# Patient Record
Sex: Female | Born: 1990 | Race: White | Hispanic: No | Marital: Single | Smoking: Never smoker
Health system: Southern US, Community
[De-identification: ages and names within clinical notes are randomized; demographics above are authoritative.]

## PROBLEM LIST (undated history)

## (undated) DIAGNOSIS — D649 Anemia, unspecified: Secondary | ICD-10-CM

## (undated) HISTORY — PX: NO PAST SURGERIES: SHX2092

---
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
## 2008-02-16 ENCOUNTER — Emergency Department: Payer: Self-pay | Admitting: Unknown Physician Specialty

## 2008-03-14 ENCOUNTER — Emergency Department: Payer: Self-pay | Admitting: Emergency Medicine

## 2010-01-30 ENCOUNTER — Emergency Department: Payer: Self-pay | Admitting: Emergency Medicine

## 2010-02-23 ENCOUNTER — Ambulatory Visit: Payer: Self-pay | Admitting: Family Medicine

## 2010-07-20 ENCOUNTER — Inpatient Hospital Stay: Payer: Self-pay | Admitting: Obstetrics and Gynecology

## 2011-02-01 ENCOUNTER — Emergency Department: Payer: Self-pay | Admitting: Emergency Medicine

## 2011-06-22 ENCOUNTER — Emergency Department: Payer: Self-pay | Admitting: Emergency Medicine

## 2011-09-01 ENCOUNTER — Emergency Department: Payer: Self-pay | Admitting: Emergency Medicine

## 2012-09-15 ENCOUNTER — Emergency Department: Payer: Self-pay | Admitting: Emergency Medicine

## 2013-03-03 ENCOUNTER — Ambulatory Visit: Payer: Self-pay

## 2013-07-07 ENCOUNTER — Emergency Department: Payer: Self-pay | Admitting: Emergency Medicine

## 2014-03-07 ENCOUNTER — Emergency Department: Payer: Self-pay | Admitting: Internal Medicine

## 2015-03-02 ENCOUNTER — Other Ambulatory Visit: Payer: Self-pay | Admitting: Physician Assistant

## 2015-03-02 DIAGNOSIS — Z3689 Encounter for other specified antenatal screening: Secondary | ICD-10-CM

## 2015-03-02 LAB — HM PAP SMEAR: HM Pap smear: NEGATIVE

## 2015-03-06 ENCOUNTER — Ambulatory Visit
Admission: RE | Admit: 2015-03-06 | Discharge: 2015-03-06 | Disposition: A | Discharge status: Home / Self Care | Payer: Medicaid Other | Source: Ambulatory Visit | Attending: Physician Assistant | Admitting: Physician Assistant

## 2015-03-06 DIAGNOSIS — Z3A28 28 weeks gestation of pregnancy: Secondary | ICD-10-CM | POA: Diagnosis not present

## 2015-03-06 DIAGNOSIS — Z36 Encounter for antenatal screening of mother: Secondary | ICD-10-CM | POA: Insufficient documentation

## 2015-03-06 DIAGNOSIS — Z3689 Encounter for other specified antenatal screening: Secondary | ICD-10-CM

## 2015-04-30 NOTE — L&D Delivery Note (Signed)
Obstetrical Delivery Note   Date of Delivery:   05/31/2015 Primary OB:   [facility] Gestational Age/EDD: [redacted]w[redacted]d (Dated by LMP=28wk u/s) Antepartum complications: Late prenatal care, poor dates, BMI 19, h/o abnormal pap smears, anemia, normal 3hr GTT  Delivered By:   Cornelia Copa MD  Delivery Type:   spontaneous vaginal delivery  Delivery Details:   CTSP because went from 3cm to SROM to 8cm to complete very quickly. Room set up and peds present and patient checked and complete, LOA and +2. Over next 2-3 contractions, patient easily delivered infant. Short cord noted; infant was vigorous and delayed cord clamping done and then cord was cut.  Anesthesia:    local Intrapartum complications: None GBS:    Negative Laceration:    1st degree perineal, repaired with 2-0 vicryl after injection of 10mL 1% lidocaine Episiotomy:    none Placenta:    Delivered and expressed via active management. Intact: yes. To pathology: no.  Estimated Blood Loss:   Baby:    Liveborn female, APGARs 8/9, weight 3480gm  Cornelia Copa. MD [employer] Pager [phone]

## 2015-05-30 ENCOUNTER — Inpatient Hospital Stay
Admission: EM | Admit: 2015-05-30 | Discharge: 2015-06-01 | DRG: 775 | Disposition: A | Discharge status: Home / Self Care | Payer: Medicaid Other | Attending: Obstetrics & Gynecology | Admitting: Obstetrics & Gynecology

## 2015-05-30 DIAGNOSIS — Z3A41 41 weeks gestation of pregnancy: Secondary | ICD-10-CM | POA: Diagnosis not present

## 2015-05-30 DIAGNOSIS — Z833 Family history of diabetes mellitus: Secondary | ICD-10-CM

## 2015-05-30 DIAGNOSIS — Z79899 Other long term (current) drug therapy: Secondary | ICD-10-CM

## 2015-05-30 DIAGNOSIS — O480 Post-term pregnancy: Secondary | ICD-10-CM | POA: Diagnosis present

## 2015-05-30 DIAGNOSIS — O99214 Obesity complicating childbirth: Principal | ICD-10-CM | POA: Diagnosis present

## 2015-05-30 DIAGNOSIS — Z6841 Body Mass Index (BMI) 40.0 and over, adult: Secondary | ICD-10-CM

## 2015-05-30 DIAGNOSIS — Z809 Family history of malignant neoplasm, unspecified: Secondary | ICD-10-CM | POA: Diagnosis not present

## 2015-05-30 HISTORY — DX: Anemia, unspecified: D64.9

## 2015-05-30 LAB — TYPE AND SCREEN
ABO/RH(D): O POS
ANTIBODY SCREEN: NEGATIVE

## 2015-05-30 LAB — URINE DRUG SCREEN, QUALITATIVE (ARMC ONLY)
Amphetamines, Ur Screen: NOT DETECTED
BENZODIAZEPINE, UR SCRN: NOT DETECTED
Barbiturates, Ur Screen: NOT DETECTED
Cannabinoid 50 Ng, Ur ~~LOC~~: NOT DETECTED
Cocaine Metabolite,Ur ~~LOC~~: NOT DETECTED
MDMA (Ecstasy)Ur Screen: NOT DETECTED
METHADONE SCREEN, URINE: NOT DETECTED
Opiate, Ur Screen: NOT DETECTED
PHENCYCLIDINE (PCP) UR S: NOT DETECTED
TRICYCLIC, UR SCREEN: NOT DETECTED

## 2015-05-30 MED ORDER — CITRIC ACID-SODIUM CITRATE 334-500 MG/5ML PO SOLN: 30.0000 mL | ORAL | Status: DC | PRN

## 2015-05-30 MED ORDER — OXYTOCIN 40 UNITS IN LACTATED RINGERS INFUSION - SIMPLE MED
1.0000 m[IU]/min | INTRAVENOUS | Status: DC
  Administered 2015-05-30: 2 m[IU]/min via INTRAVENOUS
  Administered 2015-05-31: 666 m[IU]/min via INTRAVENOUS

## 2015-05-30 MED ORDER — MISOPROSTOL 200 MCG PO TABS
ORAL_TABLET | ORAL | Status: AC
  Filled 2015-05-30: qty 4

## 2015-05-30 MED ORDER — LIDOCAINE HCL (PF) 1 % IJ SOLN
INTRAMUSCULAR | Status: AC
  Filled 2015-05-30: qty 30

## 2015-05-30 MED ORDER — LACTATED RINGERS IV SOLN: 500.0000 mL | INTRAVENOUS | Status: DC | PRN

## 2015-05-30 MED ORDER — OXYTOCIN 10 UNIT/ML IJ SOLN
INTRAMUSCULAR | Status: AC
  Filled 2015-05-30: qty 2

## 2015-05-30 MED ORDER — AMMONIA AROMATIC IN INHA
RESPIRATORY_TRACT | Status: AC
  Filled 2015-05-30: qty 10

## 2015-05-30 MED ORDER — ACETAMINOPHEN 325 MG PO TABS: 650.0000 mg | ORAL_TABLET | ORAL | Status: DC | PRN

## 2015-05-30 MED ORDER — LIDOCAINE HCL (PF) 1 % IJ SOLN: 30.0000 mL | INTRAMUSCULAR | Status: DC | PRN

## 2015-05-30 MED ORDER — LACTATED RINGERS IV SOLN
INTRAVENOUS | Status: DC
  Administered 2015-05-30: 22:00:00 via INTRAVENOUS

## 2015-05-30 MED ORDER — OXYTOCIN 40 UNITS IN LACTATED RINGERS INFUSION - SIMPLE MED
2.5000 [IU]/h | INTRAVENOUS | Status: DC
  Administered 2015-05-31: 2.5 [IU]/h via INTRAVENOUS
  Filled 2015-05-30: qty 1000

## 2015-05-30 MED ORDER — FENTANYL CITRATE (PF) 100 MCG/2ML IJ SOLN
50.0000 ug | INTRAMUSCULAR | Status: DC | PRN
  Administered 2015-05-31: 50 ug via INTRAVENOUS
  Filled 2015-05-30: qty 2

## 2015-05-30 MED ORDER — ONDANSETRON HCL 4 MG/2ML IJ SOLN: 4.0000 mg | Freq: Four times a day (QID) | INTRAMUSCULAR | Status: DC | PRN

## 2015-05-30 MED ORDER — OXYTOCIN BOLUS FROM INFUSION: 500.0000 mL | INTRAVENOUS | Status: DC

## 2015-05-30 MED ORDER — TERBUTALINE SULFATE 1 MG/ML IJ SOLN: 0.2500 mg | Freq: Once | INTRAMUSCULAR | Status: DC | PRN

## 2015-05-30 NOTE — H&P (Signed)
Obstetrics Admission History & Physical  05/30/2015 - 10:13 PM Primary OBGYN: [facility]  Chief Complaint: post dates IOL  History of Present Illness  25 y.o. [id number] @ [redacted]w[redacted]d (Dating: EDC 1/24, dated by LMP=28wk u/s), with the above CC. Pregnancy complicated by: late PNC, poor dates, BMI 43, h/o abnormal pap smears, anemia, normal 3hr  [name] denies any labor s/s or decreased FM. No s/s of pre-eclampsia  Review of Systems: , her 12 point review of systems is negative or as noted in the History of Present Illness.  PMHx:  Past Medical History  Diagnosis Date  . Anemia    PSHx:  Past Surgical History  Procedure Laterality Date  . No past surgeries     Medications:  Prescriptions prior to admission  Medication Sig Dispense Refill Last Dose  . ferrous sulfate 325 (65 FE) MG tablet Take 325 mg by mouth 2 (two) times daily with a meal.   05/30/2015 at Unknown time  . Multiple Vitamin (MULTIVITAMIN) tablet Take 1 tablet by mouth daily.   05/29/2015 at Unknown time    Allergies: has No Known Allergies. OBHx: as above. AB x 1. 06/2010 6lbs 12oz SVD, no issues with that pregnancy or delivery. GYNHx:  History of abnormal pap smears: Yes, a few years ago. Patient states that she believes she had a biopsy (maybe at [facility]?) but no h/o f/u after that History of STIs: Yes, remote          FHx:  Family History  Problem Relation Age of Onset  . Diabetes Mother   . Cancer Mother   . Cancer Maternal Grandmother   . Cancer Maternal Grandfather    Soc Hx:  Social History   Social History  . Marital Status: Single    Spouse Name: N/A  . Number of Children: N/A  . Years of Education: N/A   Occupational History  . Not on file.   Social History Main Topics  . Smoking status: Never Smoker   . Smokeless tobacco: Never Used  . Alcohol Use: No  . Drug Use: No  . Sexual Activity: Yes    Birth Control/ Protection: None   Other Topics Concern  . Not on file   Social History  Narrative  . No narrative on file    Objective    Current Vital Signs 24h Vital Sign Ranges  T 98.7 F (37.1 C) Temp  Avg: 98.7 F (37.1 C)  Min: 98.7 F (37.1 C)  Max: 98.7 F (37.1 C)  BP 133/90 mmHg BP  Min: 133/90  Max: 133/90  HR 79 Pulse  Avg: 79  Min: 79  Max: 79  RR 16 Resp  Avg: 16  Min: 16  Max: 16  SaO2    98/RA No Data Recorded       24 Hour I/O Current Shift I/O  Time Ins Outs      Rpt BP 133/86  EFM: 130 baseline, +accels, no decels, mod var  Toco: none  General: Well nourished, well developed female in no acute distress.  Skin:  Warm and dry.  Cardiovascular: Regular rate and rhythm. Respiratory:  Clear to auscultation bilateral. Normal respiratory effort Abdomen: obese, nttp Neuro/Psych:  Normal mood and affect.   SVE: per RN 2/50. At [facility] was 1-2/40-50/-3/soft/posterior on 1/26 Leopolds/EFW: 3400gm  Labs  pending  Radiology BSUS: cephalic, subjectively normal fluid and HR, station is high  Perinatal info  O pos/ Rubella  Immune / Varicella Immune/RPR pending/HIV Negative/HepB [facility]  Ag Negative/TDaP:needed / Flu shot: UTD/pap: none this pregnancy-->needed PP/OCPs  Assessment & Plan   25 y.o. G2P1011 @ 41/0 with post dates; pt stabel *IUP: category I with accels *IOL: will start with pitocin per protocol. Depending on how IOL goes, may need cx ripening but pt is borderline and a multip so will start with pitocin. Pt amenable to plan *BMI 41: fetus doesn't feel big. Will follow labor curve for surveillance for CPD *Late PNC: UDS ordered *BPs: will continue to follow and do HELLP labs PRN *GBS: neg *Analgesia: no needs  Cornelia Copa. MD [facility] OBGYN Pager [phone]

## 2015-05-31 LAB — CBC
HCT: 34.2 % — ABNORMAL LOW (ref 35.0–47.0)
Hemoglobin: 11.5 g/dL — ABNORMAL LOW (ref 12.0–16.0)
MCH: 30.1 pg (ref 26.0–34.0)
MCHC: 33.6 g/dL (ref 32.0–36.0)
MCV: 89.5 fL (ref 80.0–100.0)
PLATELETS: 170 10*3/uL (ref 150–440)
RBC: 3.82 MIL/uL (ref 3.80–5.20)
RDW: 14.5 % (ref 11.5–14.5)
WBC: 9.9 10*3/uL (ref 3.6–11.0)

## 2015-05-31 LAB — ABO/RH: ABO/RH(D): O POS

## 2015-05-31 MED ORDER — BENZOCAINE-MENTHOL 20-0.5 % EX AERO: 1.0000 "application " | INHALATION_SPRAY | CUTANEOUS | Status: DC | PRN

## 2015-05-31 MED ORDER — WITCH HAZEL-GLYCERIN EX PADS: 1.0000 "application " | MEDICATED_PAD | CUTANEOUS | Status: DC | PRN

## 2015-05-31 MED ORDER — DIBUCAINE 1 % RE OINT: 1.0000 "application " | TOPICAL_OINTMENT | RECTAL | Status: DC | PRN

## 2015-05-31 MED ORDER — PRENATAL MULTIVITAMIN CH
1.0000 | ORAL_TABLET | Freq: Every day | ORAL | Status: DC
  Administered 2015-05-31 – 2015-06-01 (×2): 1 via ORAL
  Filled 2015-05-31 (×3): qty 1

## 2015-05-31 MED ORDER — DIPHENHYDRAMINE HCL 25 MG PO CAPS: 25.0000 mg | ORAL_CAPSULE | Freq: Four times a day (QID) | ORAL | Status: DC | PRN

## 2015-05-31 MED ORDER — MAGNESIUM HYDROXIDE 400 MG/5ML PO SUSP: 30.0000 mL | ORAL | Status: DC | PRN

## 2015-05-31 MED ORDER — OXYTOCIN 40 UNITS IN LACTATED RINGERS INFUSION - SIMPLE MED
2.5000 [IU]/h | INTRAVENOUS | Status: DC | PRN
  Filled 2015-05-31: qty 1000

## 2015-05-31 MED ORDER — ACETAMINOPHEN 325 MG PO TABS: 650.0000 mg | ORAL_TABLET | ORAL | Status: DC | PRN

## 2015-05-31 MED ORDER — SIMETHICONE 80 MG PO CHEW: 80.0000 mg | CHEWABLE_TABLET | ORAL | Status: DC | PRN

## 2015-05-31 MED ORDER — OXYCODONE-ACETAMINOPHEN 5-325 MG PO TABS: 1.0000 | ORAL_TABLET | Freq: Four times a day (QID) | ORAL | Status: DC | PRN

## 2015-05-31 MED ORDER — DOCUSATE SODIUM 100 MG PO CAPS
100.0000 mg | ORAL_CAPSULE | Freq: Two times a day (BID) | ORAL | Status: DC
  Administered 2015-05-31 – 2015-06-01 (×2): 100 mg via ORAL
  Filled 2015-05-31 (×2): qty 1

## 2015-05-31 MED ORDER — LANOLIN HYDROUS EX OINT: TOPICAL_OINTMENT | CUTANEOUS | Status: DC | PRN

## 2015-05-31 MED ORDER — OXYCODONE-ACETAMINOPHEN 5-325 MG PO TABS: 2.0000 | ORAL_TABLET | Freq: Four times a day (QID) | ORAL | Status: DC | PRN

## 2015-05-31 MED ORDER — TETANUS-DIPHTH-ACELL PERTUSSIS 5-2.5-18.5 LF-MCG/0.5 IM SUSP: 0.5000 mL | Freq: Once | INTRAMUSCULAR | Status: DC

## 2015-05-31 MED ORDER — IBUPROFEN 600 MG PO TABS
600.0000 mg | ORAL_TABLET | Freq: Four times a day (QID) | ORAL | Status: DC
  Administered 2015-06-01: 600 mg via ORAL
  Filled 2015-05-31 (×2): qty 1

## 2015-05-31 MED ORDER — ONDANSETRON HCL 4 MG PO TABS: 4.0000 mg | ORAL_TABLET | ORAL | Status: DC | PRN

## 2015-05-31 MED ORDER — IBUPROFEN 600 MG PO TABS
600.0000 mg | ORAL_TABLET | Freq: Four times a day (QID) | ORAL | Status: DC
  Administered 2015-05-31 (×3): 600 mg via ORAL
  Filled 2015-05-31 (×4): qty 1

## 2015-05-31 MED ORDER — ONDANSETRON HCL 4 MG/2ML IJ SOLN: 4.0000 mg | INTRAMUSCULAR | Status: DC | PRN

## 2015-05-31 MED ORDER — SENNOSIDES-DOCUSATE SODIUM 8.6-50 MG PO TABS: 1.0000 | ORAL_TABLET | Freq: Every evening | ORAL | Status: DC | PRN

## 2015-05-31 NOTE — Progress Notes (Addendum)
Post Partum Day0 Subjective: no complaints, voiding and tolerating PO. Decided to bottle feed  Objective: Blood pressure 123/78, pulse 55, temperature 98 F (36.7 C), temperature source Oral, resp. rate 17, height  (1.626 m), weight 107.502 kg (237 lb), unknown if currently breastfeeding.  BP range: 123/78-143/83  Physical Exam:  General: alert and no distress Lochia: appropriate Uterine Fundus: firm/ U-1/ML/NT Perineum; healing well, no significant erythema DVT Evaluation: No evidence of DVT seen on physical exam.   Recent Labs  05/30/15 2233  HGB 11.5*  HCT 34.2*  WBC 9.9  PLT 170    Assessment/Plan: Stable day of delivery Continue pp care Possible discharge tomorrow O POS/ RI/ VI/ GBS negative Bottle/ pills   LOS: 1 day   Candice Clarke 05/31/2015, 10:22 AM

## 2015-05-31 NOTE — Discharge Summary (Signed)
Obstetrical Discharge Summary  Date of Admission: 05/30/2015 Date of Discharge: 06/01/2015  Primary OB: [facility]  Gestational Age at Delivery: [redacted]w[redacted]d   Antepartum complications: Late prenatal care, poor dates, BMI 72, h/o abnormal pap smears, anemia, normal 3hr GTT Reason for Admission: scheduled post dates IOL Date of Delivery: 06/01/2015  Delivered By: Cornelia Copa MD Delivery Type: spontaneous vaginal delivery Intrapartum complications/course: Precipitous labor after initiation of pitocin. UDS negative.  Anesthesia: local Placenta: Delivered and expressed via active management. Intact: yes. To pathology: no.  Laceration: 1st degree perineal (repaired) Episiotomy: none Baby: Liveborn female, APGARs 8/9, weight 3480 g.   Discharge Diagnosis: Delivered vaginal  Postpartum course: Uncomplicated. Tolerating PO, ambulating and voiding without difficulty. Pain well controlled Discharge Vital Signs:  Current Vital Signs 24h Vital Sign Ranges  T 98.5 F (36.9 C) Temp  Avg: 98.2 F (36.8 C)  Min: 97.7 F (36.5 C)  Max: 98.5 F (36.9 C)  BP 134/78 mmHg BP  Min: 113/58  Max: 134/78  HR 74 Pulse  Avg: 70.3  Min: 64  Max: 75  RR 20 Resp  Avg: 18  Min: 16  Max: 20  SaO2 100 % Not Delivered SpO2  Avg: 100 %  Min: 100 %  Max: 100 %       24 Hour I/O Current Shift I/O  Time Ins Outs   02/02 0701 - 02/02 1900 In: 240 [P.O.:240] Out: -     Patient Vitals for the past 6 hrs:  BP Temp Temp src Pulse Resp  06/01/15 0745 134/78 mmHg 98.5 F (36.9 C) Oral 74 20    Discharge Exam:  NAD Perineum: 1st degree laceration with repair Abdomen: firm fundus below the umbilicus. RRR no MRGs CTAB Ext: no c/c/e  Disposition: Home  Rh Immune globulin given: not applicable Rubella vaccine given: not applicable Tdap vaccine given in AP or PP setting: ordered PP Flu vaccine given in AP or PP setting: yes  Contraception: Ortho Tricyclen  Prenatal/Postnatal Panel: O POS//Rubella  Immune//Varicella Immune//RPR negative//HIV negative/HepB Surface Ag negative//pap history of abnormal paps in the past and no pap done this pregnancy. Pap smear needed at [facility] visit //plans to bottle feed//Flu/TDAP UTD  Plan:  Candice Clarke was discharged to home in good condition. Follow-up appointment with the [facility] in 4 weeks for a PP visit  Discharge Medications:   Medication List    STOP taking these medications        ferrous sulfate 325 (65 FE) MG tablet      TAKE these medications        multivitamin tablet  Take 1 tablet by mouth daily.     Norgestimate-Ethinyl Estradiol Triphasic 0.18/0.215/0.25 MG-35 MCG tablet  Commonly known as:  ORTHO TRI-CYCLEN (28)  Take 1 tablet by mouth daily.       Tresea Mall, CNM   This patient and plan were discussed with Dr Elesa Massed 06/01/2015

## 2015-05-31 NOTE — Discharge Instructions (Signed)
Vaginal Delivery, Care After Refer to this sheet in the next few weeks. These discharge instructions provide you with information on caring for yourself after delivery. Your caregiver may also give you specific instructions. Your treatment has been planned according to the most current medical practices available, but problems sometimes occur. Call your caregiver if you have any problems or questions after you go home. HOME CARE INSTRUCTIONS 1. Take over-the-counter or prescription medicines only as directed by your caregiver or pharmacist. 2. Do not drink alcohol, especially if you are breastfeeding or taking medicine to relieve pain. 3. Do not smoke tobacco. 4. Continue to use good perineal care. Good perineal care includes: 1. Wiping your perineum from back to front 2. Keeping your perineum clean. 3. You can do sitz baths twice a day, to help keep this area clean 5. Do not use tampons, douche or have sex until your caregiver says it is okay. 6. Shower only and avoid sitting in submerged water, aside from sitz baths 7. Wear a well-fitting bra that provides breast support. 8. Eat healthy foods. 9. Drink enough fluids to keep your urine clear or pale yellow. 10. Eat high-fiber foods such as whole grain cereals and breads, brown rice, beans, and fresh fruits and vegetables every day. These foods may help prevent or relieve constipation. 11. Avoid constipation with high fiber foods or medications, such as miralax or metamucil 12. Follow your caregiver's recommendations regarding resumption of activities such as climbing stairs, driving, lifting, exercising, or traveling. 13. Talk to your caregiver about resuming sexual activities. Resumption of sexual activities is dependent upon your risk of infection, your rate of healing, and your comfort and desire to resume sexual activity. 14. Try to have someone help you with your household activities and your newborn for at least a few days after you leave  the hospital. 15. Rest as much as possible. Try to rest or take a nap when your newborn is sleeping. 16. Increase your activities gradually. 17. Keep all of your scheduled postpartum appointments. It is very important to keep your scheduled follow-up appointments. At these appointments, your caregiver will be checking to make sure that you are healing physically and emotionally. SEEK MEDICAL CARE IF:   You are passing large clots from your vagina. Save any clots to show your caregiver.  You have a foul smelling discharge from your vagina.  You have trouble urinating.  You are urinating frequently.  You have pain when you urinate.  You have a change in your bowel movements.  You have increasing redness, pain, or swelling near your vaginal incision (episiotomy) or vaginal tear.  You have pus draining from your episiotomy or vaginal tear.  Your episiotomy or vaginal tear is separating.  You have painful, hard, or reddened breasts.  You have a severe headache.  You have blurred vision or see spots.  You feel sad or depressed.  You have thoughts of hurting yourself or your newborn.  You have questions about your care, the care of your newborn, or medicines.  You are dizzy or light-headed.  You have a rash.  You have nausea or vomiting.  You were breastfeeding and have not had a menstrual period within 12 weeks after you stopped breastfeeding.  You are not breastfeeding and have not had a menstrual period by the 12th week after delivery.  You have a fever. SEEK IMMEDIATE MEDICAL CARE IF:   You have persistent pain.  You have chest pain.  You have shortness of breath.    You faint.  You have leg pain.  You have stomach pain.  Your vaginal bleeding saturates two or more sanitary pads in 1 hour. MAKE SURE YOU:   Understand these instructions.  Will watch your condition.  Will get help right away if you are not doing well or get worse. Document Released:  04/12/2000 Document Revised: 08/30/2013 Document Reviewed: 12/11/2011 ExitCare Patient Information 2015 [employer]. This information is not intended to replace advice given to you by your health care provider. Make sure you discuss any questions you have with your health care provider.  Sitz Bath A sitz bath is a warm water bath taken in the sitting position. The water covers only the hips and butt (buttocks). We recommend using one that fits in the toilet, to help with ease of use and cleanliness. It may be used for either healing or cleaning purposes. Sitz baths are also used to relieve pain, itching, or muscle tightening (spasms). The water may contain medicine. Moist heat will help you heal and relax.  HOME CARE  Take 3 to 4 sitz baths a day. 18. Fill the bathtub half-full with warm water. 19. Sit in the water and open the drain a little. 20. Turn on the warm water to keep the tub half-full. Keep the water running constantly. 21. Soak in the water for 15 to 20 minutes. 22. After the sitz bath, pat the affected area dry. GET HELP RIGHT AWAY IF: You get worse instead of better. Stop the sitz baths if you get worse. MAKE SURE YOU:  Understand these instructions.  Will watch your condition.  Will get help right away if you are not doing well or get worse. Document Released: 05/23/2004 Document Revised: 01/08/2012 Document Reviewed: 08/13/2010 ExitCare Patient Information 2015 [employer]. This information is not intended to replace advice given to you by your health care provider. Make sure you discuss any questions you have with your health care provider.    

## 2015-06-01 ENCOUNTER — Encounter: Payer: Self-pay | Admitting: Obstetrics and Gynecology

## 2015-06-01 LAB — RPR: RPR Ser Ql: NONREACTIVE

## 2015-06-01 MED ORDER — NORGESTIM-ETH ESTRAD TRIPHASIC 0.18/0.215/0.25 MG-35 MCG PO TABS: 1.0000 | ORAL_TABLET | Freq: Every day | ORAL | Status: DC

## 2015-06-01 NOTE — Progress Notes (Signed)
Rx given for home use...discharged to home via wheelchair

## 2016-07-18 LAB — HM HIV SCREENING LAB: HM HIV Screening: NEGATIVE

## 2016-09-20 DIAGNOSIS — M545 Low back pain: Secondary | ICD-10-CM | POA: Insufficient documentation

## 2016-09-20 NOTE — ED Triage Notes (Signed)
Pt presents to ED via POV with c/o lower back pain without known injury or trauma. Pt reports pain is aching and stabbing, denies radiation of pain into lower extremities. Pt denies any change in urinary habits or frequency, denies pain or burning during urination. Pt is ambulatory to triage with steady gait, no difficulty or distress observed.

## 2016-09-21 ENCOUNTER — Emergency Department: Payer: Self-pay

## 2016-09-21 ENCOUNTER — Emergency Department
Admission: EM | Admit: 2016-09-21 | Discharge: 2016-09-21 | Disposition: A | Discharge status: Home / Self Care | Payer: Self-pay | Attending: Emergency Medicine | Admitting: Emergency Medicine

## 2016-09-21 DIAGNOSIS — M545 Low back pain, unspecified: Secondary | ICD-10-CM

## 2016-09-21 LAB — URINALYSIS, COMPLETE (UACMP) WITH MICROSCOPIC
Bilirubin Urine: NEGATIVE
Glucose, UA: NEGATIVE mg/dL
Hgb urine dipstick: NEGATIVE
Ketones, ur: NEGATIVE mg/dL
Nitrite: NEGATIVE
PH: 5 (ref 5.0–8.0)
Protein, ur: NEGATIVE mg/dL
SPECIFIC GRAVITY, URINE: 1.021 (ref 1.005–1.030)

## 2016-09-21 LAB — PREGNANCY, URINE: Preg Test, Ur: NEGATIVE

## 2016-09-21 MED ORDER — TRAMADOL HCL 50 MG PO TABS: 50.0000 mg | ORAL_TABLET | Freq: Four times a day (QID) | ORAL | 0 refills | Status: DC | PRN

## 2016-09-21 MED ORDER — KETOROLAC TROMETHAMINE 60 MG/2ML IM SOLN
60.0000 mg | Freq: Once | INTRAMUSCULAR | Status: AC
  Administered 2016-09-21: 60 mg via INTRAMUSCULAR
  Filled 2016-09-21: qty 2

## 2016-09-21 MED ORDER — LIDOCAINE 5 % EX PTCH: 1.0000 | MEDICATED_PATCH | Freq: Two times a day (BID) | CUTANEOUS | 0 refills | Status: DC

## 2016-09-21 MED ORDER — DIAZEPAM 2 MG PO TABS: 2.0000 mg | ORAL_TABLET | Freq: Four times a day (QID) | ORAL | 0 refills | Status: DC | PRN

## 2016-09-21 MED ORDER — LIDOCAINE 5 % EX PTCH
1.0000 | MEDICATED_PATCH | CUTANEOUS | Status: DC
  Administered 2016-09-21: 1 via TRANSDERMAL
  Filled 2016-09-21: qty 1

## 2016-09-21 MED ORDER — DIAZEPAM 5 MG PO TABS
5.0000 mg | ORAL_TABLET | Freq: Once | ORAL | Status: AC
  Administered 2016-09-21: 5 mg via ORAL
  Filled 2016-09-21: qty 1

## 2016-09-21 NOTE — ED Provider Notes (Signed)
[facility] Emergency Department Provider Note   ____________________________________________   First MD Initiated Contact with Patient 09/21/16 0106     (approximate)  I have reviewed the triage vital signs and the nursing notes.   HISTORY  Chief Complaint Back Pain    HPI Candice Clarke is a 26 y.o. female who comes into the hospital today with some back pain. She reports that the symptoms started yesterday. She denies doing any activities to hurt her back and denies any trauma. The pain is in her lower back. The patient took some ibuprofen at home but reports that it didn't help. The patient rates her pain 8 out of 10 in intensity currently. She denies any difficulty with urination or with bowel movements. The patient denies having pain like this in the past. She denies any other pain and states that it's just her back that hurts. She is here today for evaluation.   Past Medical History:  Diagnosis Date  . Anemia     Patient Active Problem List   Diagnosis Date Noted  . Postpartum care following vaginal delivery 06/01/2015  . Post-dates pregnancy 05/30/2015    Past Surgical History:  Procedure Laterality Date  . NO PAST SURGERIES      Prior to Admission medications   Medication Sig Start Date End Date Taking? Authorizing Provider  diazepam (VALIUM) 2 MG tablet Take 1 tablet (2 mg total) by mouth every 6 (six) hours as needed for anxiety. 09/21/16   Rebecka Apley, MD  lidocaine (LIDODERM) 5 % Place 1 patch onto the skin every 12 (twelve) hours. Remove & Discard patch within 12 hours or as directed by MD 09/21/16 09/21/17  Rebecka Apley, MD  Multiple Vitamin (MULTIVITAMIN) tablet Take 1 tablet by mouth daily.    [provider]  Norgestimate-Ethinyl Estradiol Triphasic (ORTHO TRI-CYCLEN, 28,) 0.18/0.215/0.25 MG-35 MCG tablet Take 1 tablet by mouth daily. 06/01/15   Tresea Mall, CNM  traMADol (ULTRAM) 50 MG tablet Take 1 tablet (50  mg total) by mouth every 6 (six) hours as needed. 09/21/16   Rebecka Apley, MD    Allergies Patient has no known allergies.  Family History  Problem Relation Age of Onset  . Diabetes Mother   . Cancer Mother   . Cancer Maternal Grandmother   . Cancer Maternal Grandfather     Social History Social History  Substance Use Topics  . Smoking status: Never Smoker  . Smokeless tobacco: Never Used  . Alcohol use No    Review of Systems  Constitutional: No fever/chills Eyes: No visual changes. ENT: No sore throat. Cardiovascular: Denies chest pain. Respiratory: Denies shortness of breath. Gastrointestinal: No abdominal pain.  No nausea, no vomiting.  No diarrhea.  No constipation. Genitourinary: Negative for dysuria. Musculoskeletal:  back pain. Skin: Negative for rash. Neurological: Negative for headaches, focal weakness or numbness.   ____________________________________________   PHYSICAL EXAM:  VITAL SIGNS: ED Triage Vitals  Enc Vitals Group     BP 09/20/16 2244 129/75     Pulse Rate 09/20/16 2244 79     Resp 09/20/16 2244 18     Temp 09/20/16 2244 98 F (36.7 C)     Temp Source 09/20/16 2244 Oral     SpO2 09/20/16 2244 98 %     Weight 09/20/16 2239 250 lb (113.4 kg)     Height 09/20/16 2239 5\' 4"  (1.626 m)     Head Circumference --      Peak  Flow --      Pain Score 09/20/16 2239 8     Pain Loc --      Pain Edu? --      Excl. in GC? --     Constitutional: Alert and oriented. Well appearing and in Mild distress. Eyes: Conjunctivae are normal. PERRL. EOMI. Head: Atraumatic. Nose: No congestion/rhinnorhea. Mouth/Throat: Mucous membranes are moist.  Oropharynx non-erythematous. Cardiovascular: Normal rate, regular rhythm. Grossly normal heart sounds.  Good peripheral circulation. Respiratory: Normal respiratory effort.  No retractions. Lungs CTAB. Gastrointestinal: Soft and nontender. No distention. Positive bowel sounds Musculoskeletal: No lower  extremity tenderness nor edema.  Lower lumbar spine tenderness to palpation with negative straight leg raise Neurologic:  Normal speech and language.  Skin:  Skin is warm, dry and intact.  Psychiatric: Mood and affect are normal.   ____________________________________________   LABS (all labs ordered are listed, but only abnormal results are displayed)  Labs Reviewed  URINALYSIS, COMPLETE (UACMP) WITH MICROSCOPIC - Abnormal; Notable for the following:       Result Value   Color, Urine YELLOW (*)    APPearance HAZY (*)    Leukocytes, UA TRACE (*)    Bacteria, UA RARE (*)    Squamous Epithelial / LPF 6-30 (*)    All other components within normal limits  PREGNANCY, URINE   ____________________________________________  EKG  none ____________________________________________  RADIOLOGY  Lumbar spine xray ____________________________________________   PROCEDURES  Procedure(s) performed: None  Procedures  Critical Care performed: No  ____________________________________________   INITIAL IMPRESSION / ASSESSMENT AND PLAN / ED COURSE  Pertinent labs & imaging results that were available during my care of the patient were reviewed by me and considered in my medical decision making (see chart for details).  This is a 26 year old who comes into the hospital today with lower back pain. I will place a Lidoderm patch to the patient's back. The patient will also receive some Toradol and Valium. I did x-ray the patient's back. I will reassess the patient.  Clinical Course as of Sep 22 239  Sat Sep 21, 2016  0221 Negative DG Lumbar Spine 2-3 Views [AW]    Clinical Course User Index [AW] Rebecka ApleyWebster, Kiersten Coss P, MD   The patient's pain is improved. Her urine and imaging studies are unremarkable. She will be discharged home to follow up with the acute care clinic.  ____________________________________________   FINAL CLINICAL IMPRESSION(S) / ED DIAGNOSES  Final diagnoses:    Acute midline low back pain without sciatica      NEW MEDICATIONS STARTED DURING THIS VISIT:  New Prescriptions   DIAZEPAM (VALIUM) 2 MG TABLET    Take 1 tablet (2 mg total) by mouth every 6 (six) hours as needed for anxiety.   LIDOCAINE (LIDODERM) 5 %    Place 1 patch onto the skin every 12 (twelve) hours. Remove & Discard patch within 12 hours or as directed by MD   TRAMADOL (ULTRAM) 50 MG TABLET    Take 1 tablet (50 mg total) by mouth every 6 (six) hours as needed.     Note:  This document was prepared using Dragon voice recognition software and may include unintentional dictation errors.    Rebecka ApleyWebster, Gianny Sabino P, MD 09/21/16 [phone]

## 2016-09-21 NOTE — Discharge Instructions (Signed)
Please follow up with the acute care clinic °

## 2016-10-27 ENCOUNTER — Emergency Department
Admission: EM | Admit: 2016-10-27 | Discharge: 2016-10-27 | Disposition: A | Discharge status: Home / Self Care | Payer: Self-pay | Attending: Emergency Medicine | Admitting: Emergency Medicine

## 2016-10-27 ENCOUNTER — Emergency Department: Payer: Self-pay

## 2016-10-27 ENCOUNTER — Encounter: Payer: Self-pay | Admitting: Emergency Medicine

## 2016-10-27 DIAGNOSIS — Z79899 Other long term (current) drug therapy: Secondary | ICD-10-CM | POA: Insufficient documentation

## 2016-10-27 DIAGNOSIS — M25561 Pain in right knee: Secondary | ICD-10-CM | POA: Insufficient documentation

## 2016-10-27 MED ORDER — NAPROXEN 500 MG PO TABS: 500.0000 mg | ORAL_TABLET | Freq: Two times a day (BID) | ORAL | 0 refills | Status: DC

## 2016-10-27 NOTE — ED Triage Notes (Signed)
C/O right knee pain.  Denies injury.  States pain started yesterday.  STates her job requires that she stands for long periods of time, causing knee to hurt.

## 2016-10-27 NOTE — ED Provider Notes (Signed)
[facility] Emergency Department Provider Note  ____________________________________________  Time seen: Approximately 1:54 PM  I have reviewed the triage vital signs and the nursing notes.   HISTORY  Chief Complaint Knee Pain    HPI Candice PigeonSherry N Clarke is a 26 y.o. female that presents to the emergency department with right knee pain for one day. She feels like the front of her knee is "deformed." Pain is sharp in character, and it hurts worse to bend. Patient states that she is on her feet all day at work.  No trauma. She denies fever, shortness breath, chest pain, nausea, vomiting, abdominal pain, rash, numbness, tingling, calf pain.   Past Medical History:  Diagnosis Date  . Anemia     Patient Active Problem List   Diagnosis Date Noted  . Postpartum care following vaginal delivery 06/01/2015  . Post-dates pregnancy 05/30/2015    Past Surgical History:  Procedure Laterality Date  . NO PAST SURGERIES      Prior to Admission medications   Medication Sig Start Date End Date Taking? Authorizing Provider  diazepam (VALIUM) 2 MG tablet Take 1 tablet (2 mg total) by mouth every 6 (six) hours as needed for anxiety. 09/21/16   Rebecka ApleyWebster, Allison P, MD  lidocaine (LIDODERM) 5 % Place 1 patch onto the skin every 12 (twelve) hours. Remove & Discard patch within 12 hours or as directed by MD 09/21/16 09/21/17  Rebecka ApleyWebster, Allison P, MD  Multiple Vitamin (MULTIVITAMIN) tablet Take 1 tablet by mouth daily.    [provider]  naproxen (NAPROSYN) 500 MG tablet Take 1 tablet (500 mg total) by mouth 2 (two) times daily with a meal. 10/27/16 10/27/17  Enid DerryWagner, Kavir Savoca, PA-C  Norgestimate-Ethinyl Estradiol Triphasic (ORTHO TRI-CYCLEN, 28,) 0.18/0.215/0.25 MG-35 MCG tablet Take 1 tablet by mouth daily. 06/01/15   Tresea MallGledhill, Jane, CNM  traMADol (ULTRAM) 50 MG tablet Take 1 tablet (50 mg total) by mouth every 6 (six) hours as needed. 09/21/16   Rebecka ApleyWebster, Allison P, MD     Allergies Patient has no known allergies.  Family History  Problem Relation Age of Onset  . Diabetes Mother   . Cancer Mother   . Cancer Maternal Grandmother   . Cancer Maternal Grandfather     Social History Social History  Substance Use Topics  . Smoking status: Never Smoker  . Smokeless tobacco: Never Used  . Alcohol use No     Review of Systems  Constitutional: No fever/chills Cardiovascular: No chest pain. Respiratory: No SOB. Gastrointestinal: No abdominal pain.  No nausea, no vomiting.  Musculoskeletal: Positive for knee pain. Skin: Negative for rash, abrasions, lacerations, ecchymosis. Neurological: Negative for numbness or tingling   ____________________________________________   PHYSICAL EXAM:  VITAL SIGNS: ED Triage Vitals  Enc Vitals Group     BP 10/27/16 1228 111/82     Pulse Rate 10/27/16 1228 68     Resp 10/27/16 1228 20     Temp 10/27/16 1228 98.4 F (36.9 C)     Temp Source 10/27/16 1228 Oral     SpO2 10/27/16 1228 98 %     Weight 10/27/16 1201 250 lb (113.4 kg)     Height 10/27/16 1201 5\' 4"  (1.626 m)     Head Circumference --      Peak Flow --      Pain Score 10/27/16 1201 7     Pain Loc --      Pain Edu? --      Excl. in GC? --  Constitutional: Alert and oriented. Well appearing and in no acute distress. Eyes: Conjunctivae are normal. PERRL. EOMI. Head: Atraumatic. ENT:      Ears:      Nose: No congestion/rhinnorhea.      Mouth/Throat: Mucous membranes are moist.  Neck: No stridor.   Cardiovascular: Normal rate, regular rhythm.  Good peripheral circulation. Respiratory: Normal respiratory effort without tachypnea or retractions. Lungs CTAB. Good air entry to the bases with no decreased or absent breath sounds. Musculoskeletal: Full range of motion to all extremities. No gross deformities appreciated. Tenderness to palpation over anterior patella. No effusion noted. Negative anterior drawer, posterior drawer, valgus,  varus, mcMurray, patella apprehension, apley grind. No calf pain. Neurologic:  Normal speech and language. No gross focal neurologic deficits are appreciated.  Skin:  Skin is warm, dry and intact. No rash noted.   ____________________________________________   LABS (all labs ordered are listed, but only abnormal results are displayed)  Labs Reviewed - No data to display ____________________________________________  EKG   ____________________________________________  RADIOLOGY Lexine Baton, personally viewed and evaluated these images (plain radiographs) as part of my medical decision making, as well as reviewing the written report by the radiologist.  Dg Knee Complete 4 Views Right  Result Date: 10/27/2016 CLINICAL DATA:  Right knee pain since yesterday. EXAM: RIGHT KNEE - COMPLETE 4+ VIEW COMPARISON:  None. FINDINGS: The joint spaces are maintained. No fracture or osteochondral lesion. No joint effusion. IMPRESSION: Normal knee radiographs. Electronically Signed   By: Rudie Meyer M.D.   On: 10/27/2016 14:46    ____________________________________________    PROCEDURES  Procedure(s) performed:    Procedures    Medications - No data to display   ____________________________________________   INITIAL IMPRESSION / ASSESSMENT AND PLAN / ED COURSE  Pertinent labs & imaging results that were available during my care of the patient were reviewed by me and considered in my medical decision making (see chart for details).  Review of the [place] CSRS was performed in accordance of the [facility] prior to dispensing any controlled drugs.   Patient presented to the emergency department for evaluation of knee pain for one day. Vital signs and exam are reassuring. X-ray negative for acute bony abnormalities. No signs of infection. Patient does not want me wrap or crutches. Patient will be discharged home with prescriptions for naproxen. Patient is to follow up with PCP as directed.  Patient is given ED precautions to return to the ED for any worsening or new symptoms.     ____________________________________________  FINAL CLINICAL IMPRESSION(S) / ED DIAGNOSES  Final diagnoses:  Acute pain of right knee      NEW MEDICATIONS STARTED DURING THIS VISIT:  Discharge Medication List as of 10/27/2016  3:12 PM    START taking these medications   Details  naproxen (NAPROSYN) 500 MG tablet Take 1 tablet (500 mg total) by mouth 2 (two) times daily with a meal., Starting Sun 10/27/2016, Until Mon 10/27/2017, Print            This chart was dictated using voice recognition software/Dragon. Despite best efforts to proofread, errors can occur which can change the meaning. Any change was purely unintentional.    Enid Derry, PA-C 10/27/16 1523    Jene Every, MD 10/27/16 [phone]

## 2017-05-04 ENCOUNTER — Other Ambulatory Visit: Payer: Self-pay

## 2017-05-04 ENCOUNTER — Encounter: Payer: Self-pay | Admitting: Emergency Medicine

## 2017-05-04 ENCOUNTER — Emergency Department
Admission: EM | Admit: 2017-05-04 | Discharge: 2017-05-04 | Disposition: A | Discharge status: Home / Self Care | Payer: Self-pay | Attending: Emergency Medicine | Admitting: Emergency Medicine

## 2017-05-04 DIAGNOSIS — N764 Abscess of vulva: Secondary | ICD-10-CM | POA: Insufficient documentation

## 2017-05-04 DIAGNOSIS — L0291 Cutaneous abscess, unspecified: Secondary | ICD-10-CM

## 2017-05-04 MED ORDER — CLINDAMYCIN HCL 300 MG PO CAPS: 300.0000 mg | ORAL_CAPSULE | Freq: Three times a day (TID) | ORAL | 0 refills | Status: AC

## 2017-05-04 NOTE — ED Provider Notes (Signed)
[facility] Emergency Department Provider Note  ____________________________________________   I have reviewed the triage vital signs and the nursing notes.   HISTORY  Chief Complaint Abscess   History limited by: Not Limited   HPI Candice Clarke is a 27 y.o. female who presents to the emergency department today because of concern for abscess   LOCATION:left labia DURATION:2 days TIMING: constant, worsening SEVERITY: severe QUALITY: painful, swelling CONTEXT: patient states that she first noticed discomfort 2 days ago. Starting yesterday noticed swelling and bump to left labia. Denies similar symptoms in the past. MODIFYING FACTORS: worse when wiping ASSOCIATED SYMPTOMS: denies any fevers. No nausea or vomiting. No abnormal vaginal discharge.   Per medical record review patient has a history of anemia.   Past Medical History:  Diagnosis Date  . Anemia     Patient Active Problem List   Diagnosis Date Noted  . Postpartum care following vaginal delivery 06/01/2015  . Post-dates pregnancy 05/30/2015    Past Surgical History:  Procedure Laterality Date  . NO PAST SURGERIES      Prior to Admission medications   Not on File    Allergies Patient has no known allergies.  Family History  Problem Relation Age of Onset  . Diabetes Mother   . Cancer Mother   . Cancer Maternal Grandmother   . Cancer Maternal Grandfather     Social History Social History   Tobacco Use  . Smoking status: Never Smoker  . Smokeless tobacco: Never Used  Substance Use Topics  . Alcohol use: No  . Drug use: No    Review of Systems Constitutional: No fever/chills Eyes: No visual changes. ENT: No sore throat. Cardiovascular: Denies chest pain. Respiratory: Denies shortness of breath. Gastrointestinal: No abdominal pain.  No nausea, no vomiting.  No diarrhea.   Genitourinary: Positive for swelling and pain to vulva.  Musculoskeletal: Negative for back  pain. Skin: Negative for rash. Neurological: Negative for headaches, focal weakness or numbness.  ____________________________________________   PHYSICAL EXAM:  VITAL SIGNS: ED Triage Vitals  Enc Vitals Group     BP 05/04/17 0530 132/70     Pulse Rate 05/04/17 0530 72     Resp 05/04/17 0530 16     Temp 05/04/17 0530 98.1 F (36.7 C)     Temp Source 05/04/17 0530 Oral     SpO2 05/04/17 0530 98 %     Weight 05/04/17 0531 250 lb (113.4 kg)     Height 05/04/17 0531 5\' 4"  (1.626 m)     Head Circumference --      Peak Flow --      Pain Score 05/04/17 0536 5   Constitutional: Alert and oriented. Well appearing and in no distress. Eyes: Conjunctivae are normal.  ENT   Head: Normocephalic and atraumatic.   Nose: No congestion/rhinnorhea.   Mouth/Throat: Mucous membranes are moist.   Neck: No stridor. Hematological/Lymphatic/Immunilogical: No cervical lymphadenopathy. Cardiovascular: Normal rate, regular rhythm.  No murmurs, rubs, or gallops.  Respiratory: Normal respiratory effort without tachypnea nor retractions. Breath sounds are clear and equal bilaterally. No wheezes/rales/rhonchi. Gastrointestinal: Soft and non tender. No rebound. No guarding.  Genitourinary: Small, roughly 1 cm diameter, area of swelling and tenderness to left labia majora. Was spontaneously draining.  Musculoskeletal: Normal range of motion in all extremities. No lower extremity edema. Neurologic:  Normal speech and language. No gross focal neurologic deficits are appreciated.  Skin:  Skin is warm, dry and intact. No rash noted. Psychiatric: Mood and  affect are normal. Speech and behavior are normal. Patient exhibits appropriate insight and judgment.  ____________________________________________    LABS (pertinent positives/negatives)  None  ____________________________________________   EKG  None  ____________________________________________     RADIOLOGY  None  ____________________________________________   PROCEDURES  Procedures  ____________________________________________   INITIAL IMPRESSION / ASSESSMENT AND PLAN / ED COURSE  Pertinent labs & imaging results that were available during my care of the patient were reviewed by me and considered in my medical decision making (see chart for details).  Patient presented because of concern for possible abscess to left labia. ON exam she has a small abscess that was spontaneously draining. Further pus was expressed. Will place on short course of antibiotics given continued tenderness and erythema. Discussed care with patient.   ____________________________________________   FINAL CLINICAL IMPRESSION(S) / ED DIAGNOSES  Final diagnoses:  Abscess     Note: This dictation was prepared with Dragon dictation. Any transcriptional errors that result from this process are unintentional     Phineas Semen, MD 05/04/17 [phone]

## 2017-05-04 NOTE — ED Triage Notes (Addendum)
Pt says she noticed a "knot" on her left labia; uncomfortable when she wipes after voiding; area feels "itchy" when sitting; denies drainage from site; noticed area 2 days ago

## 2017-05-04 NOTE — Discharge Instructions (Signed)
Please seek medical attention for any high fevers, chest pain, shortness of breath, change in behavior, persistent vomiting, bloody stool or any other new or concerning symptoms.  

## 2018-06-11 ENCOUNTER — Ambulatory Visit
Admission: EM | Admit: 2018-06-11 | Discharge: 2018-06-11 | Disposition: A | Discharge status: Home / Self Care | Payer: Self-pay | Attending: Family Medicine | Admitting: Family Medicine

## 2018-06-11 ENCOUNTER — Other Ambulatory Visit: Payer: Self-pay

## 2018-06-11 ENCOUNTER — Encounter: Payer: Self-pay | Admitting: Emergency Medicine

## 2018-06-11 DIAGNOSIS — J069 Acute upper respiratory infection, unspecified: Secondary | ICD-10-CM

## 2018-06-11 MED ORDER — BENZONATATE 200 MG PO CAPS: ORAL_CAPSULE | ORAL | 0 refills | Status: AC

## 2018-06-11 MED ORDER — FLUTICASONE PROPIONATE 50 MCG/ACT NA SUSP: 2.0000 | Freq: Every day | NASAL | 0 refills | Status: AC

## 2018-06-11 NOTE — ED Triage Notes (Signed)
Patient c/o cough and congestion x 1 week. Patient denies fever. Patient has been taking Tylenol cold and flu OTC for her symptoms.

## 2018-06-11 NOTE — ED Provider Notes (Signed)
[facility]    CSN: [id number] Arrival date & time: 06/11/18  1624     History   Chief Complaint Chief Complaint  Patient presents with  . Cough    HPI Candice Clarke is a 28 y.o. female.   HPI  Female presents with a cough and congestion that she has had for 1 week.  She is had no fevers.  She has postnasal drip.  She states that the cough has been so violent at times,she will actually gag and will vomit yellow frothy vomitus.  Been trying Tylenol Cold and flu for symptoms but has not been helping much.  Cough is much worse at nighttime.         Past Medical History:  Diagnosis Date  . Anemia     Patient Active Problem List   Diagnosis Date Noted  . Postpartum care following vaginal delivery 06/01/2015  . Post-dates pregnancy 05/30/2015    Past Surgical History:  Procedure Laterality Date  . NO PAST SURGERIES      OB History    Gravida  2   Para  2   Term  2   Preterm      AB      Living  2     SAB      TAB      Ectopic      Multiple  0   Live Births  2            Home Medications    Prior to Admission medications   Medication Sig Start Date End Date Taking? Authorizing Provider  benzonatate (TESSALON) 200 MG capsule Take one cap TID PRN cough 06/11/18   Ovid Curd P, PA-C  fluticasone [facility]) 50 MCG/ACT nasal spray Place 2 sprays into both nostrils daily. 06/11/18   Lutricia Feil, PA-C    Family History Family History  Problem Relation Age of Onset  . Diabetes Mother   . Cancer Mother   . Cancer Maternal Grandmother   . Cancer Maternal Grandfather     Social History Social History   Tobacco Use  . Smoking status: Never Smoker  . Smokeless tobacco: Never Used  Substance Use Topics  . Alcohol use: No  . Drug use: No     Allergies   Patient has no known allergies.   Review of Systems Review of Systems  Constitutional: Positive for activity change and appetite change. Negative for chills,  fatigue and fever.  HENT: Positive for congestion, postnasal drip and rhinorrhea.   Respiratory: Positive for cough.   Gastrointestinal: Positive for vomiting.  All other systems reviewed and are negative.    Physical Exam Triage Vital Signs ED Triage Vitals  Enc Vitals Group     BP 06/11/18 1642 (!) 144/98     Pulse Rate 06/11/18 1642 (!) 59     Resp 06/11/18 1642 18     Temp 06/11/18 1642 97.8 F (36.6 C)     Temp Source 06/11/18 1642 Oral     SpO2 06/11/18 1642 97 %     Weight 06/11/18 1641 280 lb (127 kg)     Height 06/11/18 1641 5\' 5"  (1.651 m)     Head Circumference --      Peak Flow --      Pain Score 06/11/18 1641 0     Pain Loc --      Pain Edu? --      Excl. in GC? --    No  data found.  Updated Vital Signs BP (!) 144/98 (BP Location: Left Arm)   Pulse (!) 59   Temp 97.8 F (36.6 C) (Oral)   Resp 18   Ht 5\' 5"  (1.651 m)   Wt 280 lb (127 kg)   LMP 06/08/2018   SpO2 97%   BMI 46.59 kg/m   Visual Acuity Right Eye Distance:   Left Eye Distance:   Bilateral Distance:    Right Eye Near:   Left Eye Near:    Bilateral Near:     Physical Exam Vitals signs and nursing note reviewed.  Constitutional:      General: She is not in acute distress.    Appearance: Normal appearance. She is obese. She is not ill-appearing, toxic-appearing or diaphoretic.  HENT:     Head: Normocephalic and atraumatic.     Right Ear: Tympanic membrane, ear canal and external ear normal.     Left Ear: Tympanic membrane, ear canal and external ear normal.     Nose: Rhinorrhea present. No congestion.     Mouth/Throat:     Mouth: Mucous membranes are moist.     Pharynx: Oropharynx is clear. No oropharyngeal exudate or posterior oropharyngeal erythema.  Eyes:     General:        Right eye: No discharge.        Left eye: No discharge.     Conjunctiva/sclera: Conjunctivae normal.  Neck:     Musculoskeletal: Normal range of motion and neck supple.  Pulmonary:     Effort:  Pulmonary effort is normal.     Breath sounds: Normal breath sounds.  Musculoskeletal: Normal range of motion.  Lymphadenopathy:     Cervical: No cervical adenopathy.  Skin:    General: Skin is warm and dry.  Neurological:     General: No focal deficit present.     Mental Status: She is alert and oriented to person, place, and time.  Psychiatric:        Mood and Affect: Mood normal.        Behavior: Behavior normal.        Thought Content: Thought content normal.        Judgment: Judgment normal.      UC Treatments / Results  Labs (all labs ordered are listed, but only abnormal results are displayed) Labs Reviewed - No data to display  EKG None  Radiology No results found.  Procedures Procedures (including critical care time)  Medications Ordered in UC Medications - No data to display  Initial Impression / Assessment and Plan / UC Course  I have reviewed the triage vital signs and the nursing notes.  Pertinent labs & imaging results that were available during my care of the patient were reviewed by me and considered in my medical decision making (see chart for details).   Has a upper respiratory infection.  Treat her with cough suppressants.  He does not want any cough syrups and prefers [employer].  Recommended the use of Flonase to help with the postnasal drip which may be triggering her vomiting.  She is not improving she should follow-up with her primary care or may return to our clinic   Final Clinical Impressions(s) / UC Diagnoses   Final diagnoses:  Upper respiratory tract infection, unspecified type   Discharge Instructions   None    ED Prescriptions    Medication Sig Dispense Auth. Provider   benzonatate (TESSALON) 200 MG capsule Take one cap TID PRN cough 30 capsule  Ovid Curdoemer, William P, PA-C   fluticasone (FLONASE) 50 MCG/ACT nasal spray Place 2 sprays into both nostrils daily. 16 g Lutricia Feiloemer, William P, PA-C     Controlled Substance  Prescriptions [place] Controlled Substance Registry consulted? Not Applicable   Lutricia FeilRoemer, William P, PA-C 06/11/18 1752

## 2018-12-08 ENCOUNTER — Ambulatory Visit: Payer: Self-pay | Admitting: Physician Assistant

## 2018-12-08 ENCOUNTER — Ambulatory Visit (LOCAL_COMMUNITY_HEALTH_CENTER): Payer: Self-pay | Admitting: Physician Assistant

## 2018-12-08 ENCOUNTER — Other Ambulatory Visit: Payer: Self-pay

## 2018-12-08 VITALS — BP 126/84 | Ht 64.0 in | Wt 264.0 lb

## 2018-12-08 DIAGNOSIS — Z3009 Encounter for other general counseling and advice on contraception: Secondary | ICD-10-CM

## 2018-12-08 DIAGNOSIS — Z30011 Encounter for initial prescription of contraceptive pills: Secondary | ICD-10-CM

## 2018-12-08 DIAGNOSIS — Z01419 Encounter for gynecological examination (general) (routine) without abnormal findings: Secondary | ICD-10-CM

## 2018-12-08 DIAGNOSIS — Z113 Encounter for screening for infections with a predominantly sexual mode of transmission: Secondary | ICD-10-CM

## 2018-12-08 LAB — WET PREP FOR TRICH, YEAST, CLUE
Trichomonas Exam: NEGATIVE
Yeast Exam: NEGATIVE

## 2018-12-08 MED ORDER — NORGESTIM-ETH ESTRAD TRIPHASIC 0.18/0.215/0.25 MG-35 MCG PO TABS: 1.0000 | ORAL_TABLET | Freq: Every day | ORAL | 0 refills | Status: AC

## 2018-12-09 ENCOUNTER — Encounter: Payer: Self-pay | Admitting: Physician Assistant

## 2018-12-09 NOTE — Progress Notes (Signed)
Family Planning Visit- Repeat Yearly Visit  Subjective:  Candice Clarke is a 28 y.o. being seen today for an well woman visit and to discuss family planning options.    She is currently using condoms most of the time for pregnancy prevention. Patient reports she does not  want a pregnancy in the next year. Patient  does not have any active problems on file.  Chief Complaint  Patient presents with  . Contraception    Patient reports that she is due for a pap and would like to restart OCs. States that she was previously on Ortho Tri Cyclen without any problems.  LMP 12/04/2018 and normal.  States that she is using condoms always and last sex was earlier today.  Same partner for about 15 yrs.  G3P2 with 1SAB.  Mom with h/o HTN and DM.  Patient denies any chronic medical conditions, surgeries, clotting disorders and migraines.  Denies any change to personal and family history since last visit.    Does the patient desire a pregnancy in the next year? (OKQ flowsheet)  See flowsheet for other program required questions.   Body mass index is 45.32 kg/m. - Patient is eligible for diabetes screening based on BMI and age 75>40?  not applicable HA1C ordered? not applicable  Patient reports 1 of partners in last year. Desires STI screening?  Yes  Does the patient have a current or past history of drug use? No   No components found for: HCV]   Health Maintenance Due  Topic Date Due  . TETANUS/TDAP  11/14/2007  . PAP SMEAR-Modifier  03/01/2018  . INFLUENZA VACCINE  11/28/2018    Review of Systems  All other systems reviewed and are negative.   The following portions of the patient's history were reviewed and updated as appropriate: allergies, current medications, past family history, past medical history, past social history, past surgical history and problem list. Problem list updated.  Objective:   Vitals:   12/08/18 1731  BP: 126/84  Weight: 264 lb (119.7 kg)  Height: 5\' 4"  (1.626 m)     Physical Exam Vitals signs reviewed.    See STD/IS note from this date for PE findings.   Assessment and Plan:  Candice Clarke is a 28 y.o. female presenting [facility] for a family planning visit.  Contraception counseling: Reviewed all forms of birth control options available including abstinence; over the counter/barrier methods; hormonal contraceptive medication including pill, patch, ring, injection,contraceptive implant; hormonal and nonhormonal IUDs; permanent sterilization options including vasectomy and the various tubal sterilization modalities. Risks and benefits reviewed.  Questions were answered.   Patient desires to start OCs, this was prescribed for patient. She will follow up in  3-6 months and prn for surveillance.  She was told to call with any further questions, or with any concerns about this method of contraception.  Emphasized use of condoms 100% of the time for STI prevention.  1. Encounter for counseling regarding contraception Counseled patient as above on BCMs.   Rec condoms with all sex for STD protection and especially if late/missed OCs. - Norgestimate-Ethinyl Estradiol Triphasic (TRI-SPRINTEC) 0.18/0.215/0.25 MG-35 MCG tablet; Take 1 tablet by mouth daily.  Dispense: 6 Package; Refill: 0  2. OCP (oral contraceptive pills) initiation Patient counseled to start Tri Sprintec today . Rec condoms as back up especially for next 2 weeks. RTC in 3-6 months for RP. - Norgestimate-Ethinyl Estradiol Triphasic (TRI-SPRINTEC) 0.18/0.215/0.25 MG-35 MCG tablet; Take 1 tablet by mouth  daily.  Dispense: 6 Package; Refill: 0  3. Gynecologic exam normal See IS/STD visit for today for exam findings.     No follow-ups on file.  No future appointments.  Jerene Dilling, PA

## 2018-12-09 NOTE — Progress Notes (Signed)
    STI clinic/screening visit  Subjective:  Candice Clarke is a 28 y.o. female being seen today for an STI screening visit. The patient reports they do not have symptoms.  Patient has the following medical conditions:  There are no active problems to display for this patient.    Chief Complaint  Patient presents with  . SEXUALLY TRANSMITTED DISEASE    HPI  Patient reports that she wants a screening today.  Denies any symptoms or concerns today.   See flowsheet for further details and programmatic requirements.    The following portions of the patient's history were reviewed and updated as appropriate: allergies, current medications, past medical history, past social history, past surgical history and problem list.  Objective:  There were no vitals filed for this visit.  Physical Exam Constitutional:      General: She is not in acute distress.    Appearance: Normal appearance. She is obese.  HENT:     Head: Normocephalic and atraumatic.     Mouth/Throat:     Mouth: Mucous membranes are moist.     Pharynx: Oropharynx is clear. No oropharyngeal exudate or posterior oropharyngeal erythema.  Neck:     Musculoskeletal: Neck supple.  Pulmonary:     Effort: Pulmonary effort is normal.  Abdominal:     Palpations: Abdomen is soft. There is no mass.     Tenderness: There is no abdominal tenderness. There is no guarding or rebound.  Genitourinary:    General: Normal vulva.     Rectum: Normal.     Comments: External genitalia/pubic area without nits, lice, edema, erythema, lesions and inguinal adenopathy. Vagina with normal mucosa and small amount of brownish discharge. Cervix without visible lesions. Uterus firm, mobile, nt, no masses, no CMT, no adnexal tenderness or fullness. Lymphadenopathy:     Cervical: No cervical adenopathy.  Skin:    General: Skin is warm.     Findings: No bruising, erythema or rash.  Neurological:     Mental Status: She is alert and oriented to  person, place, and time.  Psychiatric:        Mood and Affect: Mood normal.        Behavior: Behavior normal.        Thought Content: Thought content normal.        Judgment: Judgment normal.       Assessment and Plan:  MAYBEL DAMBROSIO is a 28 y.o. female presenting to the [facility] for STI screening  1. Screening for STD (sexually transmitted disease) Patient desires screening today and is asymptomatic. Rec condoms with all sex Await test results.  Counseled that RN will call if needs to RTC for treatment once results are back. - WET PREP FOR [place], YEAST, CLUE - Chlamydia/Gonorrhea [place] [facility] - HIV [place] [facility] - Syphilis Serology, [place] [facility]  2. Encounter for counseling regarding contraception Patient requested pap today since it is due and to restart OCs. See separate [facility] note. - IGP, [facility] HPV     No follow-ups on file.  No future appointments.  Jerene Dilling, PA

## 2018-12-13 LAB — IGP, APTIMA HPV
HPV Aptima: NEGATIVE
PAP Smear Comment: 0

## 2019-01-23 ENCOUNTER — Emergency Department: Payer: Self-pay

## 2019-01-23 ENCOUNTER — Other Ambulatory Visit: Payer: Self-pay

## 2019-01-23 ENCOUNTER — Encounter: Payer: Self-pay | Admitting: Emergency Medicine

## 2019-01-23 ENCOUNTER — Emergency Department
Admission: EM | Admit: 2019-01-23 | Discharge: 2019-01-23 | Disposition: A | Discharge status: Home / Self Care | Payer: Self-pay | Attending: Emergency Medicine | Admitting: Emergency Medicine

## 2019-01-23 DIAGNOSIS — M25561 Pain in right knee: Secondary | ICD-10-CM | POA: Insufficient documentation

## 2019-01-23 DIAGNOSIS — Z79899 Other long term (current) drug therapy: Secondary | ICD-10-CM | POA: Insufficient documentation

## 2019-01-23 MED ORDER — MELOXICAM 15 MG PO TABS: 15.0000 mg | ORAL_TABLET | Freq: Every day | ORAL | 1 refills | Status: AC

## 2019-01-23 NOTE — ED Notes (Signed)
Pt with c/o right knee pain starting yesterday. No injury to knee per pt. Right kneecap slightly swollen at top of kneecap.

## 2019-01-23 NOTE — ED Triage Notes (Signed)
R knee pain started yesterday, denies injury.

## 2019-01-23 NOTE — ED Provider Notes (Signed)
[facility] Emergency Department Provider Note  ____________________________________________  Time seen: Approximately 7:00 PM  I have reviewed the triage vital signs and the nursing notes.   HISTORY  Chief Complaint Knee Pain    HPI Candice Clarke is a 28 y.o. female presents to the emergency department with acute right knee pain.  Patient rates her right knee pain 8 out of 10 in intensity.  She experiences her pain over the insertion for the quadriceps tendon.  She has noticed difficulty bending her knee.  She denies a locking or catching sensation.  She denies falls or mechanisms of trauma.  She states that she has experienced right knee pain in the past and does not have a specific diagnosis for her prior knee pain.  No redness overlying the knee.  No other alleviating measures have been attempted.        Past Medical History:  Diagnosis Date  . Anemia     There are no active problems to display for this patient.   Past Surgical History:  Procedure Laterality Date  . NO PAST SURGERIES      Prior to Admission medications   Medication Sig Start Date End Date Taking? Authorizing Provider  benzonatate (TESSALON) 200 MG capsule Take one cap TID PRN cough 06/11/18   Ovid Curd P, PA-C  fluticasone [facility]) 50 MCG/ACT nasal spray Place 2 sprays into both nostrils daily. 06/11/18   Lutricia Feil, PA-C  meloxicam (MOBIC) 15 MG tablet Take 1 tablet (15 mg total) by mouth daily for 7 days. 01/23/19 01/30/19  Orvil Feil, PA-C  Norgestimate-Ethinyl Estradiol Triphasic (TRI-SPRINTEC) 0.18/0.215/0.25 MG-35 MCG tablet Take 1 tablet by mouth daily. 12/08/18   Matt Holmes, PA    Allergies Patient has no known allergies.  Family History  Problem Relation Age of Onset  . Diabetes Mother   . Cancer Mother   . Hypertension Mother   . Cancer Maternal Grandmother   . Hypertension Maternal Grandmother   . Cancer Maternal Grandfather   . Asthma  Sister   . Hypertension Sister   . Bipolar disorder Brother     Social History Social History   Tobacco Use  . Smoking status: Never Smoker  . Smokeless tobacco: Never Used  Substance Use Topics  . Alcohol use: No  . Drug use: No     Review of Systems  Constitutional: No fever/chills Eyes: No visual changes. No discharge ENT: No upper respiratory complaints. Cardiovascular: no chest pain. Respiratory: no cough. No SOB. Gastrointestinal: No abdominal pain.  No nausea, no vomiting.  No diarrhea.  No constipation. Musculoskeletal: Patient has right knee pain. Skin: Negative for rash, abrasions, lacerations, ecchymosis. Neurological: Negative for headaches, focal weakness or numbness.   ____________________________________________   PHYSICAL EXAM:  VITAL SIGNS: ED Triage Vitals [01/23/19 1735]  Enc Vitals Group     BP (!) 145/89     Pulse Rate 95     Resp 20     Temp 99.1 F (37.3 C)     Temp Source Oral     SpO2 100 %     Weight 240 lb (108.9 kg)     Height 5\' 4"  (1.626 m)     Head Circumference      Peak Flow      Pain Score 9     Pain Loc      Pain Edu?      Excl. in GC?      Constitutional: Alert and oriented.  Well appearing and in no acute distress. Eyes: Conjunctivae are normal. PERRL. EOMI. Head: Atraumatic. Cardiovascular: Normal rate, regular rhythm. Normal S1 and S2.  Good peripheral circulation. Respiratory: Normal respiratory effort without tachypnea or retractions. Lungs CTAB. Good air entry to the bases with no decreased or absent breath sounds. Musculoskeletal: Patient has 5 out of 5 strength in the lower extremities bilaterally and symmetrically.  She has tenderness to palpation over the insertion for the quadriceps tendon and has difficulty performing a straight leg raise test on the right.  No other deficits were noted with provocative testing at the right knee.  Palpable dorsalis pedis pulse bilaterally and symmetrically. Neurologic:  Normal  speech and language. No gross focal neurologic deficits are appreciated.  Skin:  Skin is warm, dry and intact. No rash noted. Psychiatric: Mood and affect are normal. Speech and behavior are normal. Patient exhibits appropriate insight and judgement.   ____________________________________________   LABS (all labs ordered are listed, but only abnormal results are displayed)  Labs Reviewed - No data to display ____________________________________________  EKG   ____________________________________________  RADIOLOGY I personally viewed and evaluated these images as part of my medical decision making, as well as reviewing the written report by the radiologist.  Dg Knee Complete 4 Views Right  Result Date: 01/23/2019 CLINICAL DATA:  Knee pain without trauma. EXAM: RIGHT KNEE - COMPLETE 4+ VIEW COMPARISON:  None. FINDINGS: Enthesopathic changes are seen at the superior patella. No fracture, dislocation, or joint effusion. IMPRESSION: Enthesopathic changes at the superior patella. No other abnormalities. Electronically Signed   By: Dorise Bullion III M.D   On: 01/23/2019 18:29    ____________________________________________    PROCEDURES  Procedure(s) performed:    Procedures    Medications - No data to display   ____________________________________________   INITIAL IMPRESSION / ASSESSMENT AND PLAN / ED COURSE  Pertinent labs & imaging results that were available during my care of the patient were reviewed by me and considered in my medical decision making (see chart for details).  Review of the [place] CSRS was performed in accordance of the [place] prior to dispensing any controlled drugs.           Assessment and plan Right knee pain 28 year old female presents to the emergency department with acute right knee pain that she has experienced for the past 2 days.  X-ray examination reveals a large osteophyte that is formed along the superior pole of the patella and is  located in the proximity of patient's pain.  I reviewed patient's x-rays with patient.  Patient was started on daily meloxicam and advised to use ice over the affected area.  She was given a referral to orthopedics.  All patient questions were answered.    ____________________________________________  FINAL CLINICAL IMPRESSION(S) / ED DIAGNOSES  Final diagnoses:  Acute pain of right knee      NEW MEDICATIONS STARTED DURING THIS VISIT:  ED Discharge Orders         Ordered    meloxicam (MOBIC) 15 MG tablet  Daily     01/23/19 1855              This chart was dictated using voice recognition software/Dragon. Despite best efforts to proofread, errors can occur which can change the meaning. Any change was purely unintentional.    Lannie Fields, PA-C 01/23/19 1904    Earleen Newport, MD 01/23/19 2016

## 2019-04-15 ENCOUNTER — Encounter: Payer: Self-pay | Admitting: Physician Assistant

## 2019-04-15 NOTE — Progress Notes (Signed)
Pap card mailed. Next pap 5 years per [place] PA. Aileen Fass, RN

## 2019-04-15 NOTE — Progress Notes (Signed)
Patient into clinic 12/08/2018, for a [facility] to have pap and STD screening done and to restart OCs.  Pap was signed off on but results note mistakenly not completed.  Pap was normal with negative HPV.  Patient needs repeat cotest pap in 5 yrs.  (11/2023).

## 2020-02-09 IMAGING — DX DG KNEE COMPLETE 4+V*R*
4 series · 4 of 4 positions shown · non-contrast
Comparison: None.

CLINICAL DATA: Knee pain without trauma.

EXAM:
RIGHT KNEE - COMPLETE 4+ VIEW

[knee ap]
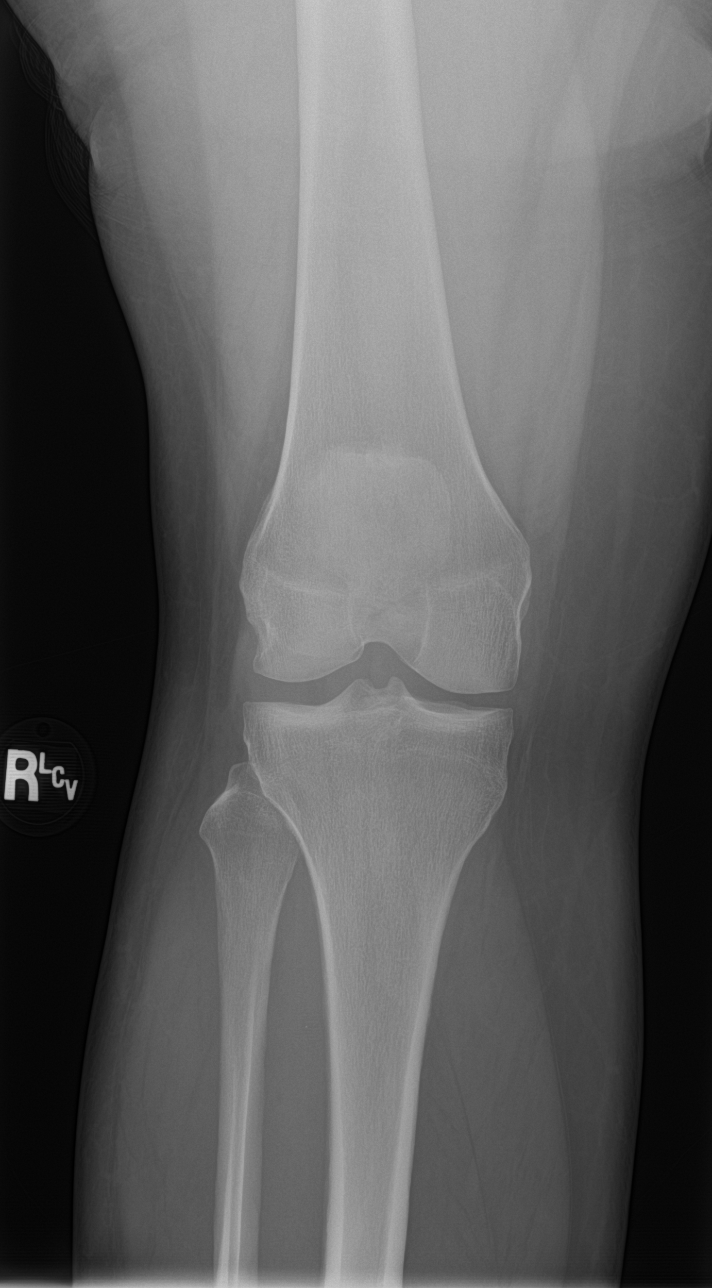

[knee lat]
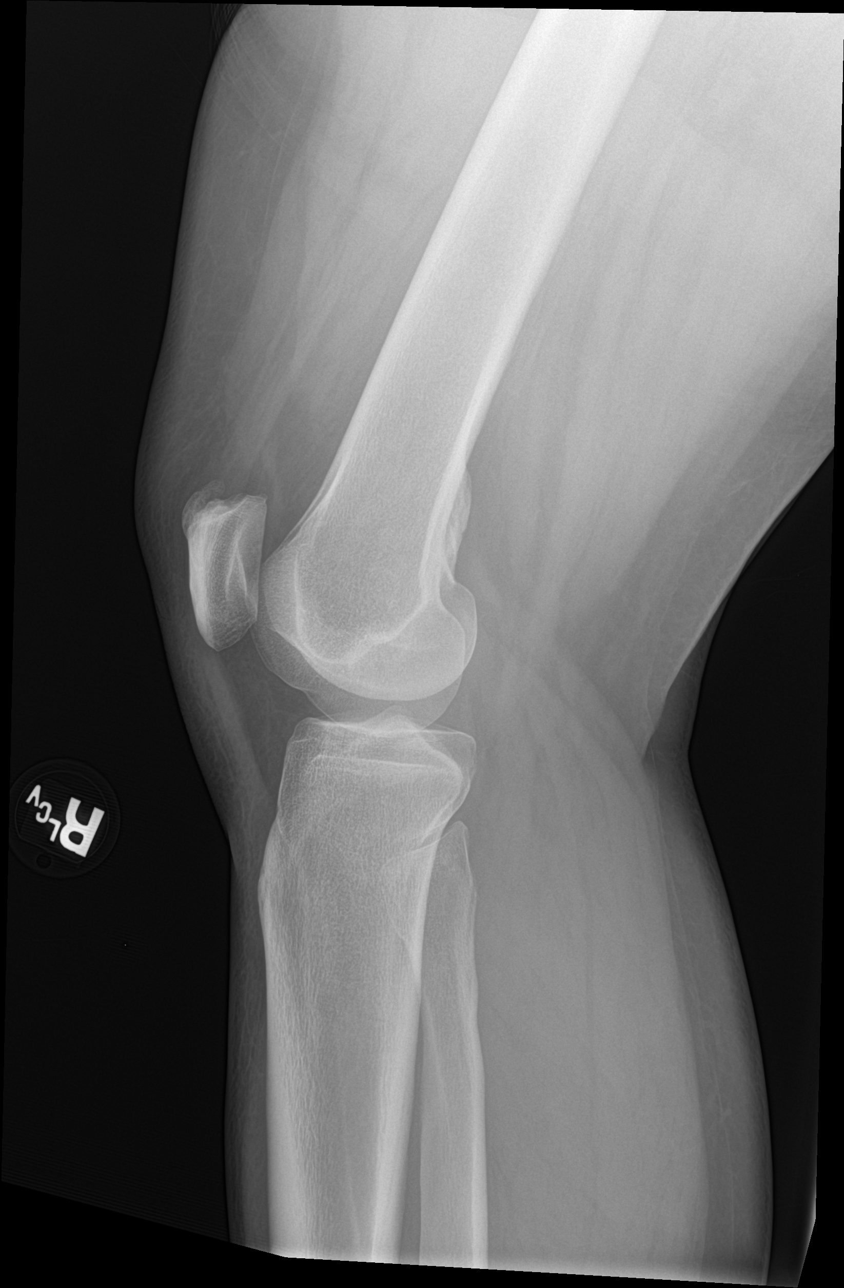

[knee obl (1 of 2)]
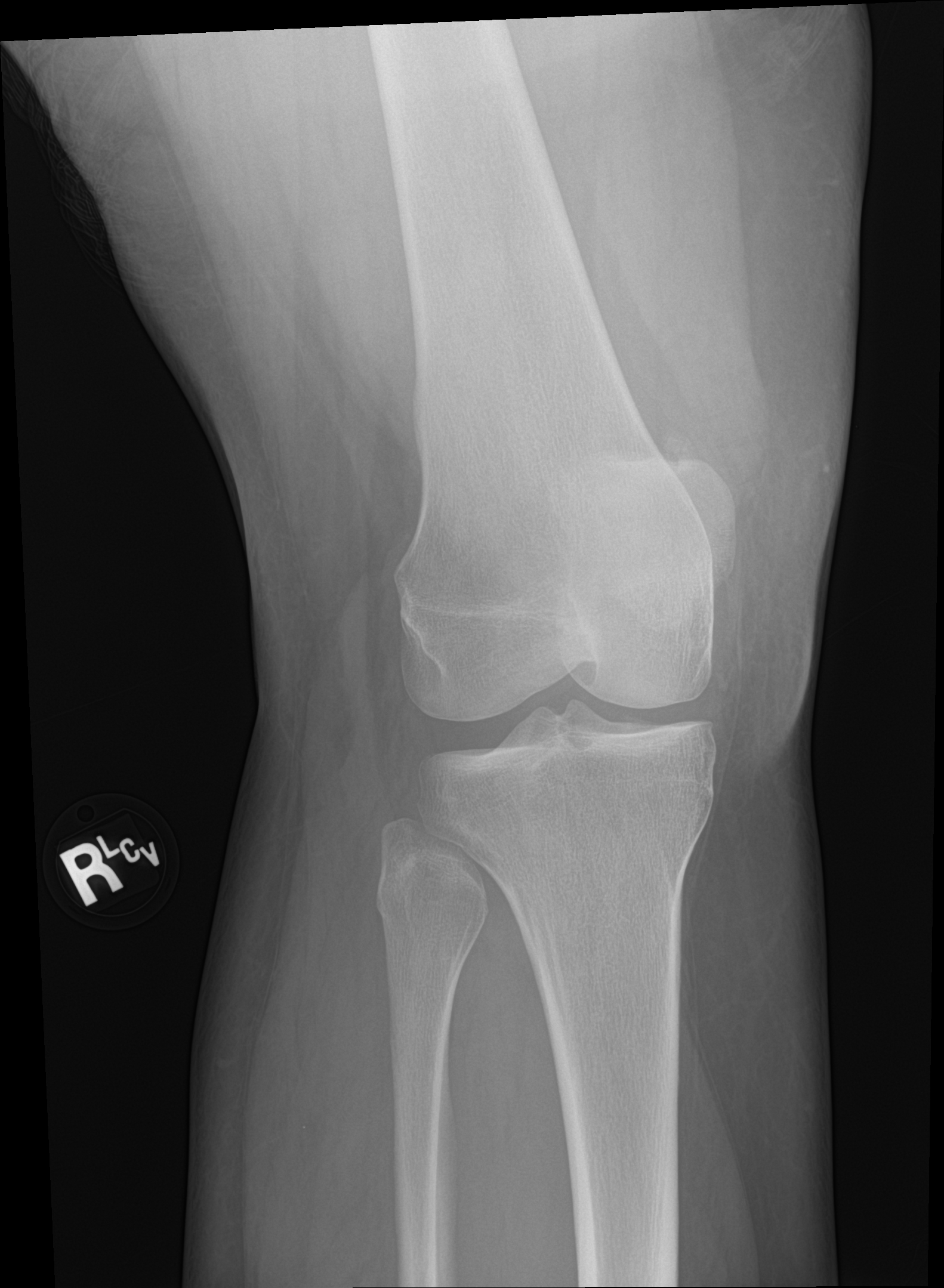

[knee obl (2 of 2)]
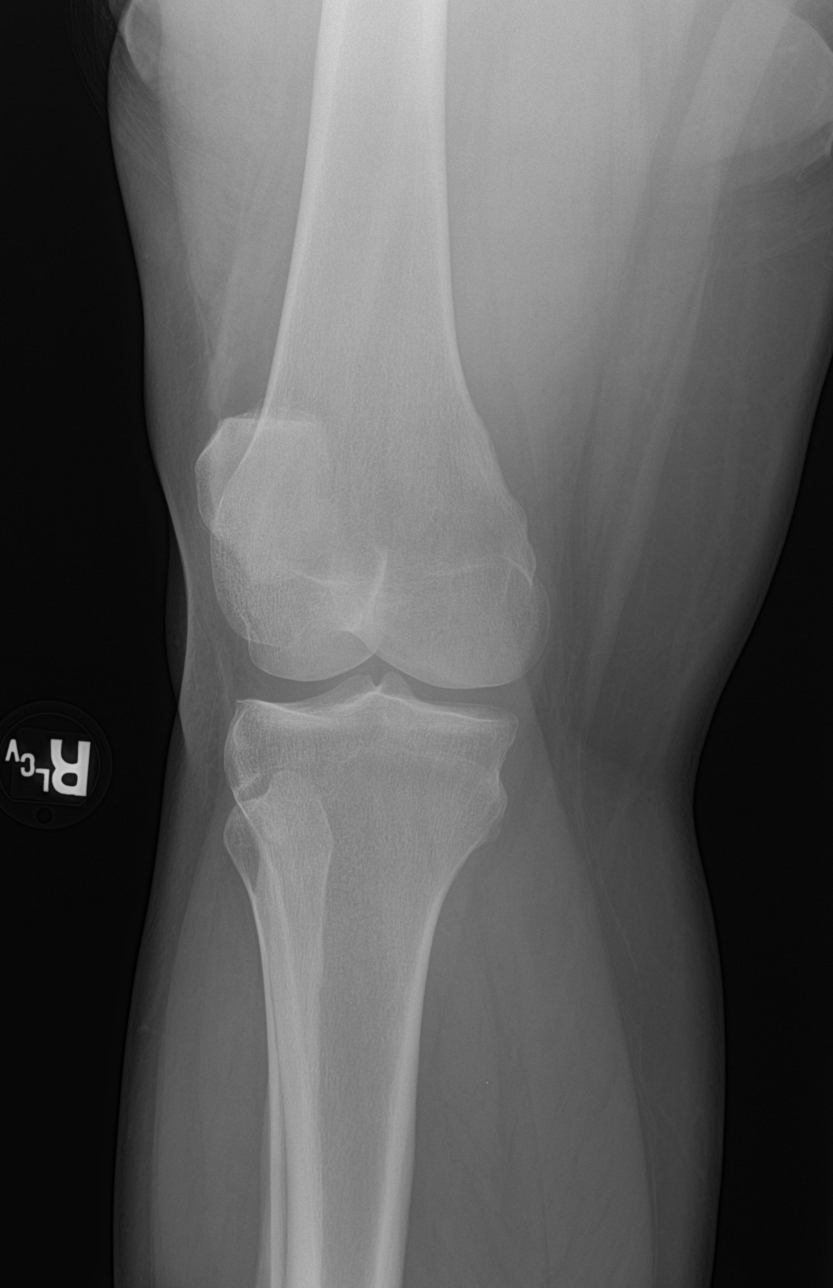

[4 of 4 positions shown; findings below may reference images not displayed]

FINDINGS: Enthesopathic changes are seen at the superior patella. No fracture,
dislocation, or joint effusion.
IMPRESSION: Enthesopathic changes at the superior patella. No other
abnormalities.
